# Patient Record
Sex: Male | Born: 1970 | Race: White | Hispanic: No | Marital: Single | Smoking: Current every day smoker
Health system: Southern US, Community
[De-identification: ages and names within clinical notes are randomized; demographics above are authoritative.]

---

## 2012-05-04 ENCOUNTER — Encounter (HOSPITAL_BASED_OUTPATIENT_CLINIC_OR_DEPARTMENT_OTHER): Payer: Self-pay

## 2012-05-04 ENCOUNTER — Other Ambulatory Visit (HOSPITAL_BASED_OUTPATIENT_CLINIC_OR_DEPARTMENT_OTHER): Payer: Self-pay | Admitting: Family Medicine

## 2012-05-04 ENCOUNTER — Ambulatory Visit (HOSPITAL_BASED_OUTPATIENT_CLINIC_OR_DEPARTMENT_OTHER)
Admission: RE | Admit: 2012-05-04 | Discharge: 2012-05-04 | Disposition: A | Discharge status: Home / Self Care | Payer: BC Managed Care – PPO | Source: Ambulatory Visit | Attending: Family Medicine | Admitting: Family Medicine

## 2012-05-04 DIAGNOSIS — R109 Unspecified abdominal pain: Secondary | ICD-10-CM

## 2012-05-04 MED ORDER — IOHEXOL 300 MG/ML  SOLN
100.0000 mL | Freq: Once | INTRAMUSCULAR | Status: AC | PRN
  Administered 2012-05-04: 100 mL via INTRAVENOUS

## 2014-05-06 ENCOUNTER — Other Ambulatory Visit: Payer: Self-pay | Admitting: Physician Assistant

## 2014-05-06 DIAGNOSIS — R7401 Elevation of levels of liver transaminase levels: Secondary | ICD-10-CM

## 2014-05-06 DIAGNOSIS — R740 Nonspecific elevation of levels of transaminase and lactic acid dehydrogenase [LDH]: Principal | ICD-10-CM

## 2014-05-07 ENCOUNTER — Ambulatory Visit (INDEPENDENT_AMBULATORY_CARE_PROVIDER_SITE_OTHER): Payer: Managed Care, Other (non HMO)

## 2014-05-07 DIAGNOSIS — R7401 Elevation of levels of liver transaminase levels: Secondary | ICD-10-CM

## 2014-05-07 DIAGNOSIS — R740 Nonspecific elevation of levels of transaminase and lactic acid dehydrogenase [LDH]: Secondary | ICD-10-CM

## 2015-04-17 ENCOUNTER — Encounter (HOSPITAL_BASED_OUTPATIENT_CLINIC_OR_DEPARTMENT_OTHER): Payer: Self-pay | Admitting: *Deleted

## 2015-04-17 ENCOUNTER — Emergency Department (HOSPITAL_BASED_OUTPATIENT_CLINIC_OR_DEPARTMENT_OTHER)
Admission: EM | Admit: 2015-04-17 | Discharge: 2015-04-17 | Disposition: A | Discharge status: Home / Self Care | Payer: Managed Care, Other (non HMO) | Attending: Emergency Medicine | Admitting: Emergency Medicine

## 2015-04-17 ENCOUNTER — Emergency Department (HOSPITAL_BASED_OUTPATIENT_CLINIC_OR_DEPARTMENT_OTHER): Payer: Managed Care, Other (non HMO)

## 2015-04-17 DIAGNOSIS — F17210 Nicotine dependence, cigarettes, uncomplicated: Secondary | ICD-10-CM | POA: Insufficient documentation

## 2015-04-17 DIAGNOSIS — R079 Chest pain, unspecified: Secondary | ICD-10-CM | POA: Diagnosis not present

## 2015-04-17 LAB — BASIC METABOLIC PANEL
ANION GAP: 7 (ref 5–15)
BUN: 11 mg/dL (ref 6–20)
CALCIUM: 9.2 mg/dL (ref 8.9–10.3)
CO2: 25 mmol/L (ref 22–32)
Chloride: 106 mmol/L (ref 101–111)
Creatinine, Ser: 0.88 mg/dL (ref 0.61–1.24)
GLUCOSE: 93 mg/dL (ref 65–99)
POTASSIUM: 3.9 mmol/L (ref 3.5–5.1)
SODIUM: 138 mmol/L (ref 135–145)

## 2015-04-17 LAB — CBC
HCT: 43.5 % (ref 39.0–52.0)
HEMOGLOBIN: 15.4 g/dL (ref 13.0–17.0)
MCH: 30.9 pg (ref 26.0–34.0)
MCHC: 35.4 g/dL (ref 30.0–36.0)
MCV: 87.3 fL (ref 78.0–100.0)
Platelets: 250 10*3/uL (ref 150–400)
RBC: 4.98 MIL/uL (ref 4.22–5.81)
RDW: 12.1 % (ref 11.5–15.5)
WBC: 7.2 10*3/uL (ref 4.0–10.5)

## 2015-04-17 LAB — TROPONIN I

## 2015-04-17 MED ORDER — NAPROXEN 500 MG PO TABS: 500.0000 mg | ORAL_TABLET | Freq: Two times a day (BID) | ORAL | Status: AC

## 2015-04-17 MED ORDER — ASPIRIN 81 MG PO CHEW
324.0000 mg | CHEWABLE_TABLET | Freq: Once | ORAL | Status: AC
  Administered 2015-04-17: 324 mg via ORAL
  Filled 2015-04-17: qty 4

## 2015-04-17 MED ORDER — MORPHINE SULFATE (PF) 4 MG/ML IV SOLN
4.0000 mg | Freq: Once | INTRAVENOUS | Status: AC
  Administered 2015-04-17: 4 mg via INTRAVENOUS
  Filled 2015-04-17: qty 1

## 2015-04-17 MED FILL — NAPROXEN 500 MG TABLET: 500 | 15 days supply | Qty: 30 | Fill #0

## 2015-04-17 NOTE — ED Notes (Signed)
MD at bedside discussing test results and dispo plan of care. 

## 2015-04-17 NOTE — ED Notes (Signed)
Pt has texted his girlfriend to come get him.  Pt voiced understanding that he is not allowed to drive.

## 2015-04-17 NOTE — Discharge Instructions (Signed)

## 2015-04-17 NOTE — ED Provider Notes (Signed)
CSN: [id number]     Arrival date & time 04/17/15  1157 History   First MD Initiated Contact with Patient 04/17/15 1324     Chief Complaint  Patient presents with  . Chest Pain    Patient is a 45 y.o. male presenting with chest pain. The history is provided by the patient.  Chest Pain Pain location:  L chest Pain quality: not aching, not burning, no pressure and not sharp   Pain quality comment:  Cant describe the pain Pain radiates to:  Does not radiate Pain severity:  Moderate Onset quality:  Sudden Duration:  6 hours Timing:  Constant Chronicity:  New Relieved by:  Nothing Exacerbated by: turning his head a certain way does increase the pain. Associated symptoms: no abdominal pain, no back pain, no fever, no shortness of breath and not vomiting   Risk factors: smoking   Risk factors: no aortic disease, no coronary artery disease, no prior DVT/PE and no surgery   No family history of heart disease.  History reviewed. No pertinent past medical history. History reviewed. No pertinent past surgical history. No family history on file. Social History  Substance Use Topics  . Smoking status: Current Every Day Smoker -- 1.00 packs/day    Types: Cigarettes  . Smokeless tobacco: None  . Alcohol Use: None    Review of Systems  Constitutional: Negative for fever.  Respiratory: Negative for shortness of breath.   Cardiovascular: Positive for chest pain.  Gastrointestinal: Negative for vomiting and abdominal pain.  Musculoskeletal: Negative for back pain.  All other systems reviewed and are negative.     Allergies  Review of patient's allergies indicates no known allergies.  Home Medications   Prior to Admission medications   Medication Sig Start Date End Date Taking? Authorizing Provider  naproxen (NAPROSYN) 500 MG tablet Take 1 tablet (500 mg total) by mouth 2 (two) times daily. 04/17/15   Linwood Dibbles, MD   BP 117/83 mmHg  Pulse 80  Temp(Src) 98 F (36.7 C) (Oral)  Resp  20  Ht 5\' 10"  (1.778 m)  Wt 77.111 kg  BMI 24.39 kg/m2  SpO2 95% Physical Exam  Constitutional: He appears well-developed and well-nourished. No distress.  HENT:  Head: Normocephalic and atraumatic.  Right Ear: External ear normal.  Left Ear: External ear normal.  Eyes: Conjunctivae are normal. Right eye exhibits no discharge. Left eye exhibits no discharge. No scleral icterus.  Neck: Neck supple. No tracheal deviation present.  Cardiovascular: Normal rate, regular rhythm and intact distal pulses.   Pulmonary/Chest: Effort normal and breath sounds normal. No stridor. No respiratory distress. He has no wheezes. He has no rales.  Abdominal: Soft. Bowel sounds are normal. He exhibits no distension. There is no tenderness. There is no rebound and no guarding.  Musculoskeletal: He exhibits no edema or tenderness.  Neurological: He is alert. He has normal strength. No cranial nerve deficit (no facial droop, extraocular movements intact, no slurred speech) or sensory deficit. He exhibits normal muscle tone. He displays no seizure activity. Coordination normal.  Skin: Skin is warm and dry. No rash noted.  Psychiatric: He has a normal mood and affect.  Nursing note and vitals reviewed.   ED Course  Procedures (including critical care time) Labs Review Labs Reviewed  BASIC METABOLIC PANEL  CBC  TROPONIN I  TROPONIN I    Imaging Review Dg Chest 2 View  04/17/2015  CLINICAL DATA:  Chest pain starting this morning.  Smoking history EXAM: CHEST  2 VIEW COMPARISON:  None. FINDINGS: The heart size and mediastinal contours are within normal limits. Both lungs are clear. The visualized skeletal structures are unremarkable. IMPRESSION: Lungs are clear and there is no evidence of acute cardiopulmonary abnormality. Electronically Signed   By: Bary RichardStan  Maynard M.D.   On: 04/17/2015 13:37   I have personally reviewed and evaluated these images and lab results as part of my medical decision-making.   EKG  Interpretation   Date/Time:  Friday April 17 2015 12:09:04 EST Ventricular Rate:  83 PR Interval:  130 QRS Duration: 84 QT Interval:  344 QTC Calculation: 404 R Axis:   79 Text Interpretation:  Normal sinus rhythm Normal ECG No old tracing to  compare Confirmed by Hildred Pharo  MD-J, Darran Gabay ([id number]) on 04/17/2015 12:18:12 PM      MDM   Final diagnoses:  Chest pain, unspecified chest pain type    Pt is low risk for heat disease.   Heart score is zero.   No dyspnea.  No pe risk factors.  PERC negative. No tearing pain.  No back pain.  Doubt aortic dissection.  Doubt ACS, PE.  Plan on 2 sets of cardiac enzymes.  If negative, dc with nsaids.  Follow up with PCP.      Linwood DibblesJon Shakir Petrosino, MD 04/17/15 [phone]

## 2015-04-17 NOTE — ED Notes (Signed)
C/o chest pain above left breast. bil arm tingling . No other sx.

## 2018-11-22 ENCOUNTER — Other Ambulatory Visit (HOSPITAL_BASED_OUTPATIENT_CLINIC_OR_DEPARTMENT_OTHER): Payer: Self-pay | Admitting: Physician Assistant

## 2018-11-22 DIAGNOSIS — R748 Abnormal levels of other serum enzymes: Secondary | ICD-10-CM

## 2018-11-26 ENCOUNTER — Other Ambulatory Visit: Payer: Managed Care, Other (non HMO)

## 2018-11-27 ENCOUNTER — Ambulatory Visit (INDEPENDENT_AMBULATORY_CARE_PROVIDER_SITE_OTHER): Payer: Managed Care, Other (non HMO)

## 2018-11-27 ENCOUNTER — Other Ambulatory Visit: Payer: Self-pay

## 2018-11-27 DIAGNOSIS — R748 Abnormal levels of other serum enzymes: Secondary | ICD-10-CM | POA: Diagnosis not present

## 2018-12-20 ENCOUNTER — Other Ambulatory Visit (HOSPITAL_BASED_OUTPATIENT_CLINIC_OR_DEPARTMENT_OTHER): Payer: Self-pay | Admitting: Physician Assistant

## 2018-12-20 DIAGNOSIS — R935 Abnormal findings on diagnostic imaging of other abdominal regions, including retroperitoneum: Secondary | ICD-10-CM

## 2018-12-26 ENCOUNTER — Other Ambulatory Visit: Payer: Self-pay

## 2018-12-26 ENCOUNTER — Ambulatory Visit (HOSPITAL_BASED_OUTPATIENT_CLINIC_OR_DEPARTMENT_OTHER)
Admission: RE | Admit: 2018-12-26 | Discharge: 2018-12-26 | Disposition: A | Discharge status: Home / Self Care | Payer: Managed Care, Other (non HMO) | Source: Ambulatory Visit | Attending: Physician Assistant | Admitting: Physician Assistant

## 2018-12-26 ENCOUNTER — Encounter (HOSPITAL_BASED_OUTPATIENT_CLINIC_OR_DEPARTMENT_OTHER): Payer: Self-pay

## 2018-12-26 DIAGNOSIS — R935 Abnormal findings on diagnostic imaging of other abdominal regions, including retroperitoneum: Secondary | ICD-10-CM | POA: Insufficient documentation

## 2018-12-26 MED ORDER — IOHEXOL 350 MG/ML SOLN
100.0000 mL | Freq: Once | INTRAVENOUS | Status: AC | PRN
  Administered 2018-12-26: 100 mL via INTRAVENOUS

## 2021-08-09 IMAGING — US ULTRASOUND ABDOMEN COMPLETE
1 series · 13 of 25 positions shown · non-contrast
Comparison: May 07, 2014

CLINICAL DATA: Abnormal liver enzymes

EXAM:
ABDOMEN ULTRASOUND COMPLETE

[Series 1: ultrasound abdomen complete · 0.17mm/px · 13 of 180 slices shown]
[im 1/180]
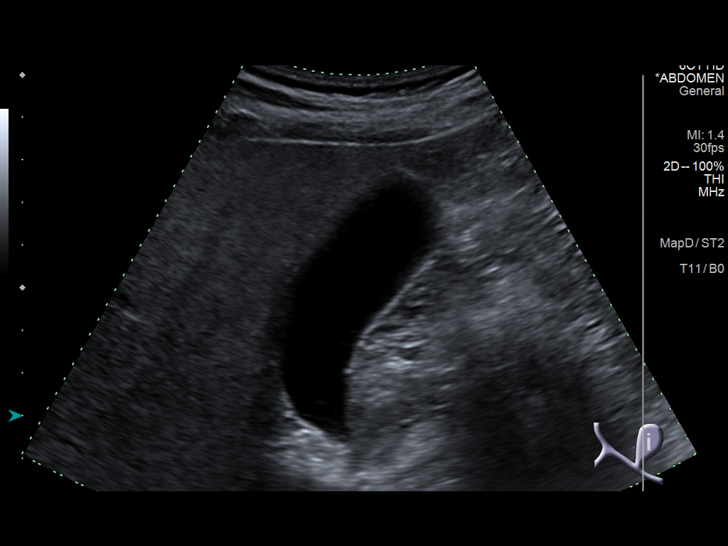
[im 15/180]
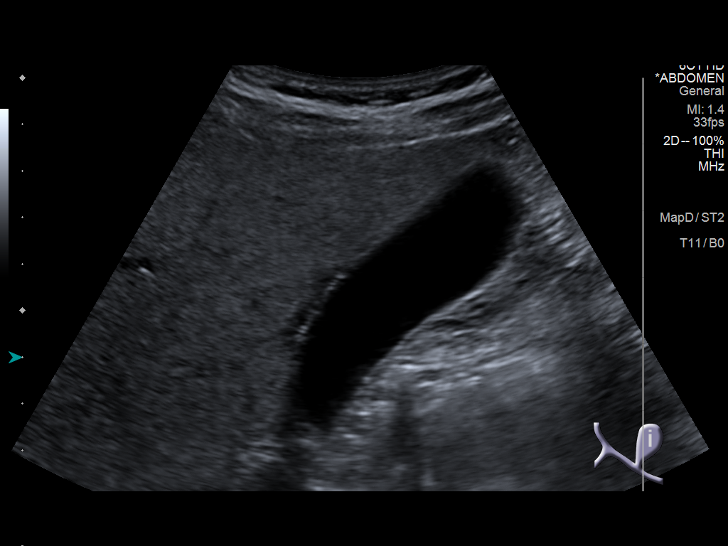
[im 30/180]
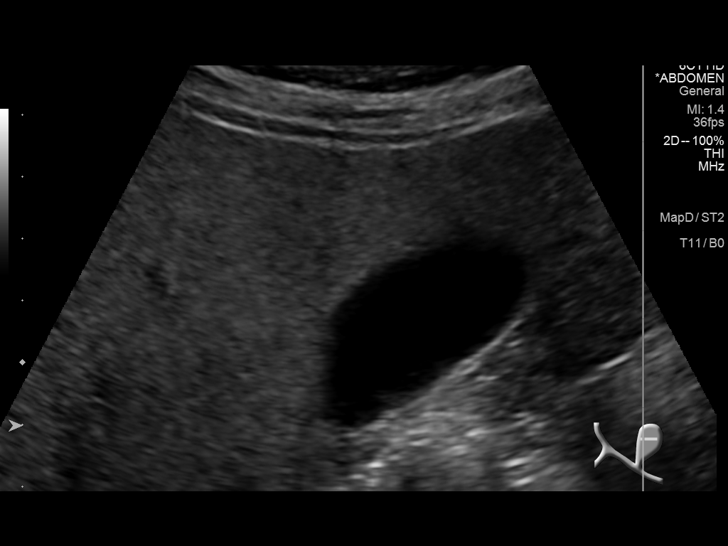
[im 45/180]
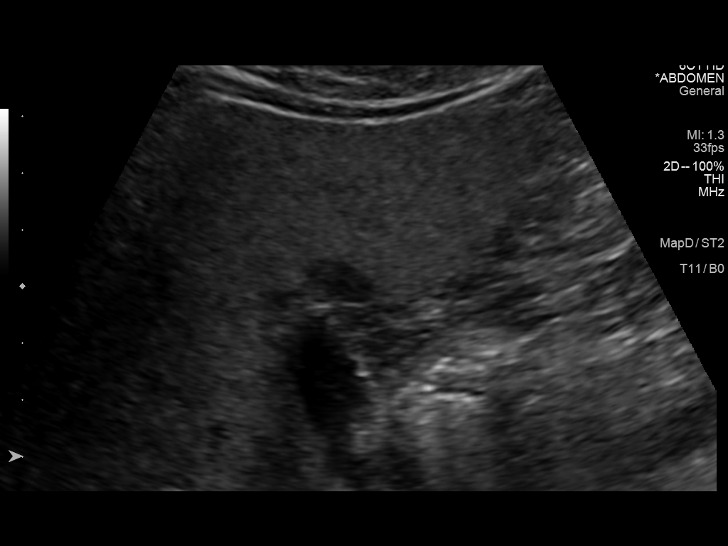
[im 60/180]
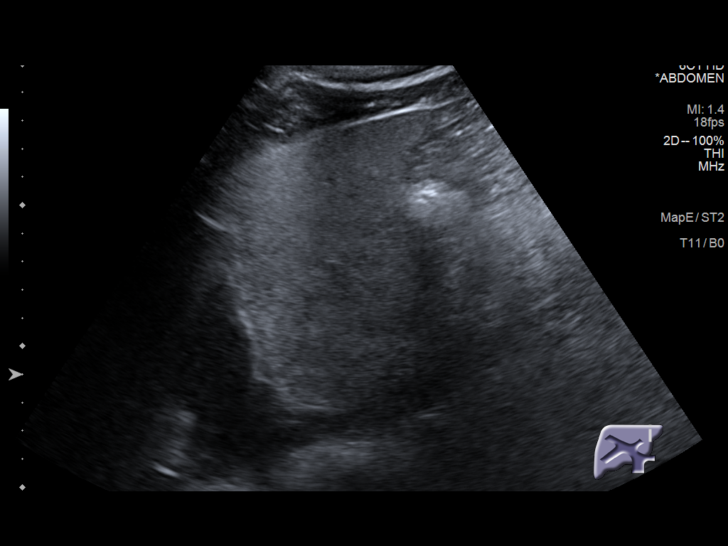
[im 75/180]
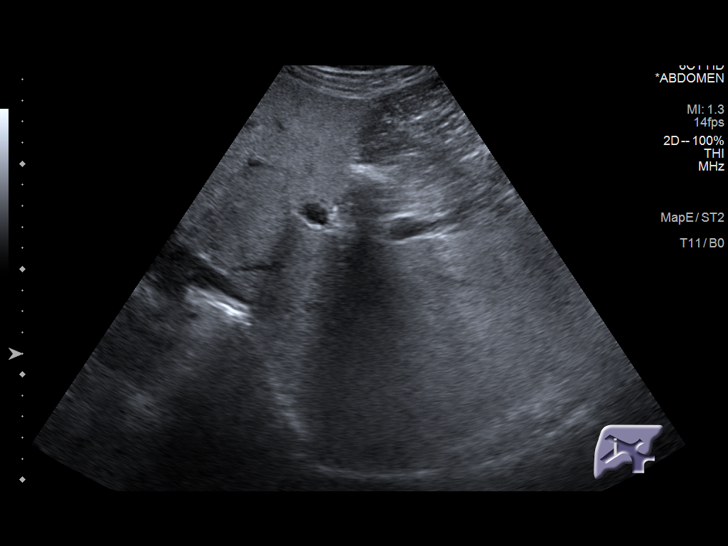
[im 90/180]
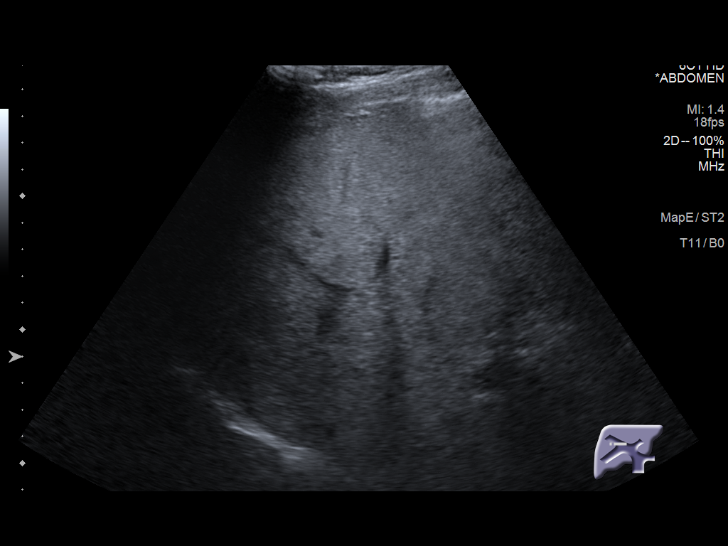
[im 105/180]
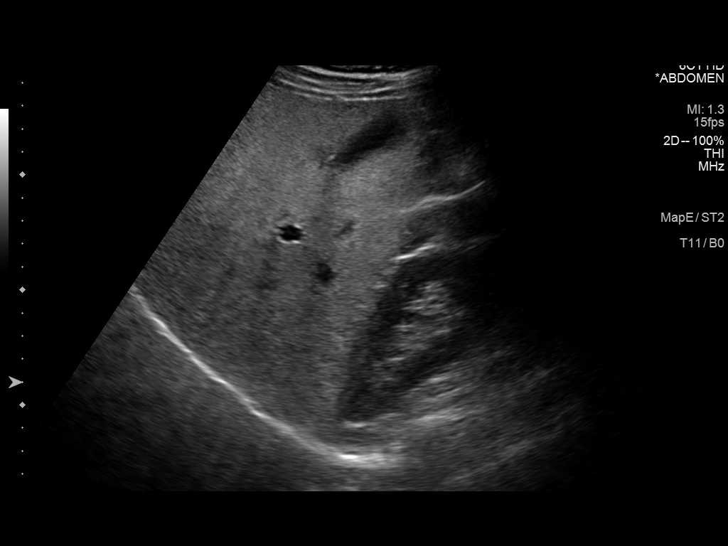
[im 120/180]
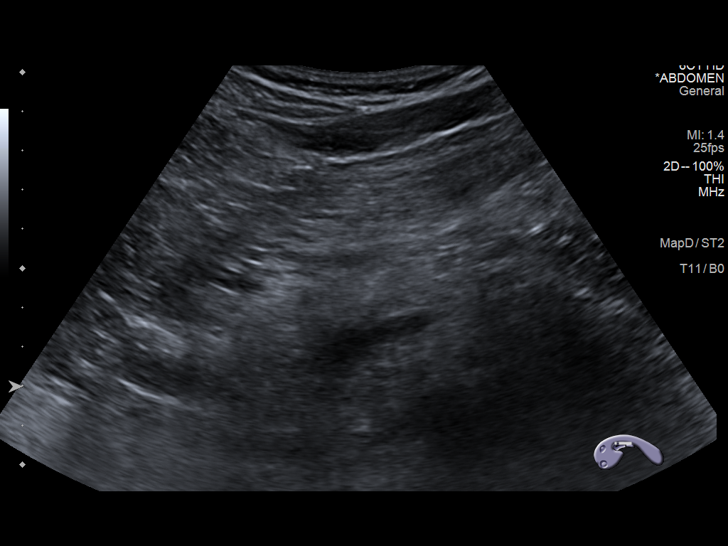
[im 135/180]
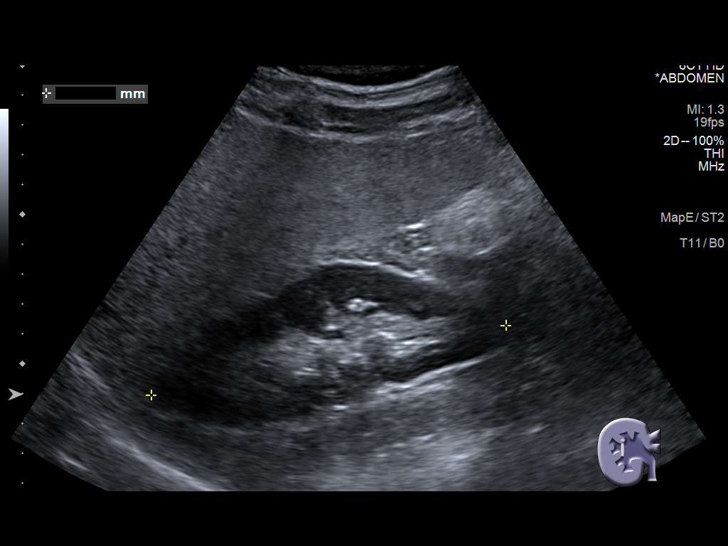
[im 150/180]
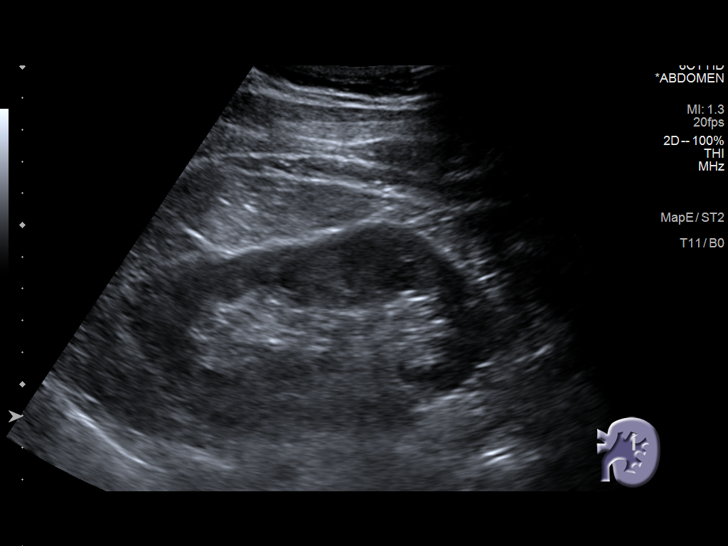
[im 165/180]
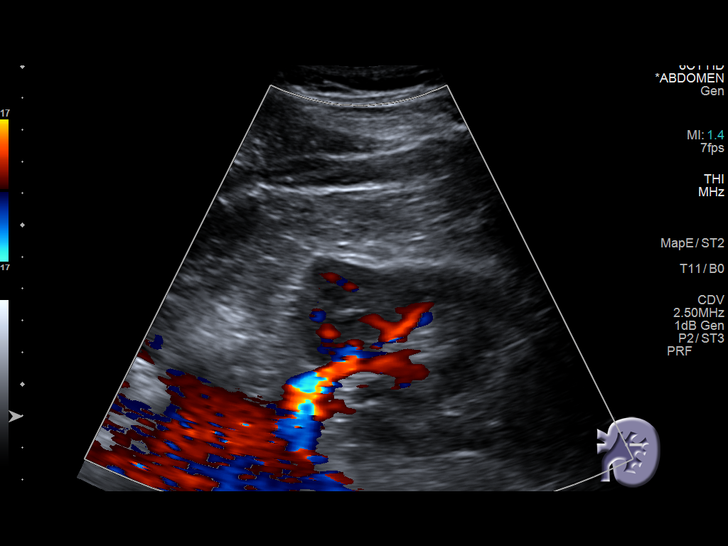
[im 180/180]
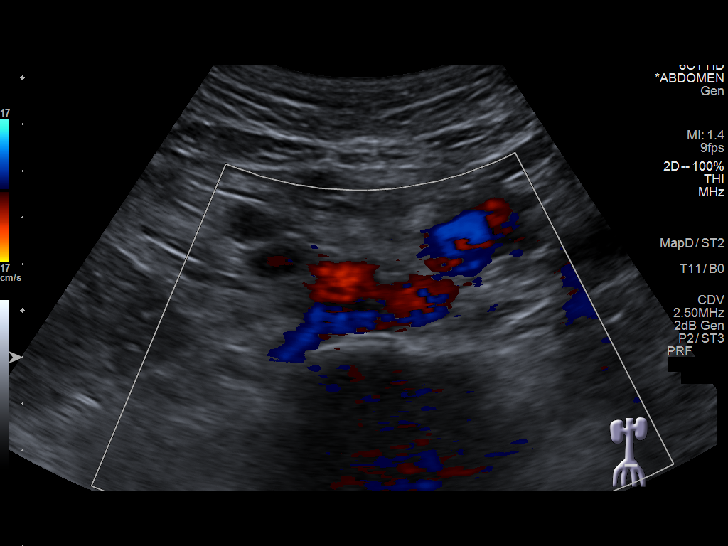

[13 of 25 positions shown; findings below may reference images not displayed]

FINDINGS: Gallbladder: No gallstones or wall thickening visualized. There is
no pericholecystic fluid. No sonographic Murphy sign noted by
sonographer.

Common bile duct: Diameter: 2 mm. No intrahepatic, common hepatic,
or common bile duct dilatation.

Liver: Liver echogenicity is increased diffusely. There is a
somewhat decreased echogenicity focus in the right lobe of the liver
near the gallbladder fossa measuring 2.3 x 1.8 x 2.7 cm. No other
focal liver lesion evident. Portal vein is patent on color Doppler
imaging with normal direction of blood flow towards the liver.

IVC: No abnormality visualized.

Pancreas: No pancreatic mass or inflammatory focus.

Spleen: Size and appearance within normal limits.

Right Kidney: Length: 12.1 cm. Echogenicity within normal limits. No
mass or hydronephrosis visualized.

Left Kidney: Length: 11.9 cm. Echogenicity within normal limits. No
mass or hydronephrosis visualized.

Abdominal aorta: No aneurysm visualized.

Other findings: No demonstrable ascites.
IMPRESSION: 1. Diffuse increase in liver echogenicity, a finding indicative of
hepatic steatosis.

2. Focal area of decreased attenuation in the right lobe of the
liver toward the gallbladder fossa measuring 2.3 x 1.8 x 2.7 cm.
This area potentially may represent fatty sparing. Given its
somewhat masslike appearance, however, it would be prudent to
correlate with MR or CT of the liver pre and serial post-contrast to
further assess.

3.  Study otherwise unremarkable.

## 2021-09-07 IMAGING — CT CT ABDOMEN WO/W CM
2 of 9 series · 11 of 46 positions shown, 17 images · IV contrast (omnipaque)
Comparison: Ultrasound on 11/27/2018, and CT on 05/04/2012

CLINICAL DATA: Elevated liver function tests. Indeterminate liver
lesion on recent ultrasound.

EXAM:
CT ABDOMEN WITHOUT AND WITH CONTRAST
TECHNIQUE: Multidetector CT imaging of the abdomen was performed following the
standard protocol before and following the bolus administration of
intravenous contrast.
CONTRAST:  100mL OMNIPAQUE IOHEXOL 350 MG/ML SOLN

[Series 4: coronal arterial · coronal · arterial · 0.51mm/px · 3 of 88 slices shown, 4 images]
[im 22/88  soft-tissue]
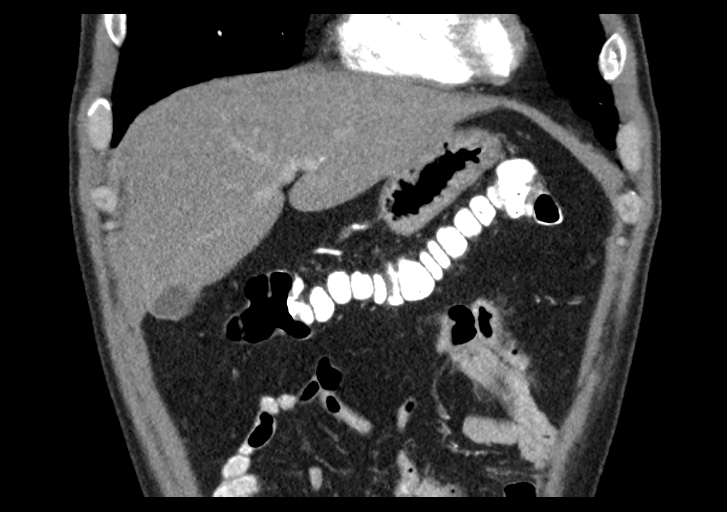
[im 44/88  soft-tissue]
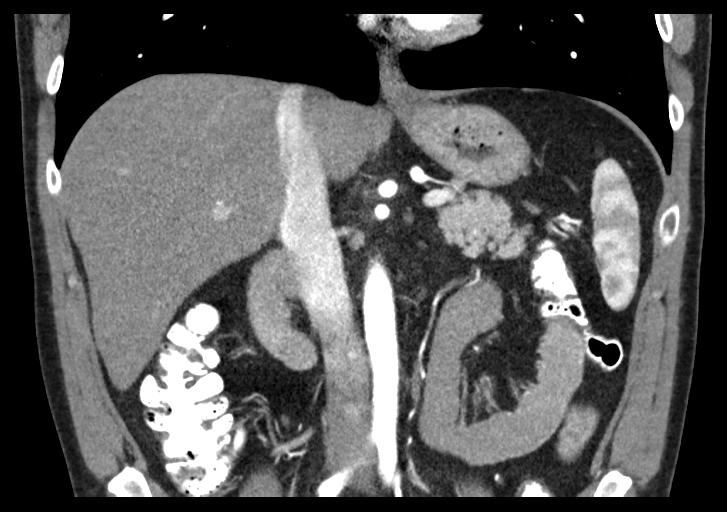
[im 44/88  bone]
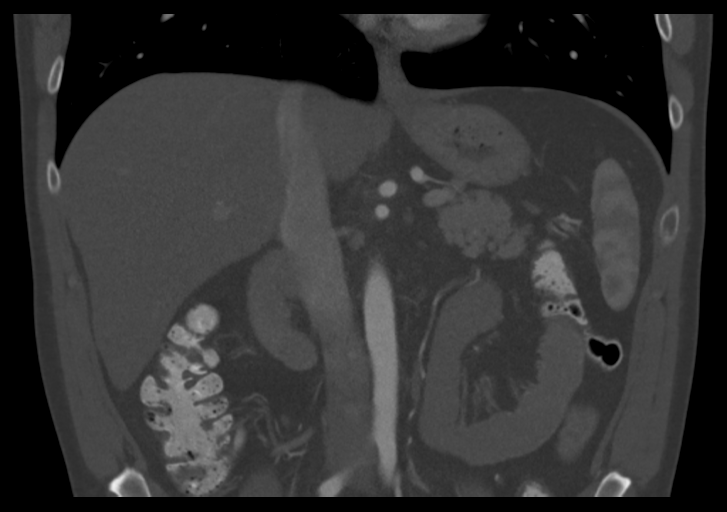
[im 66/88  soft-tissue]
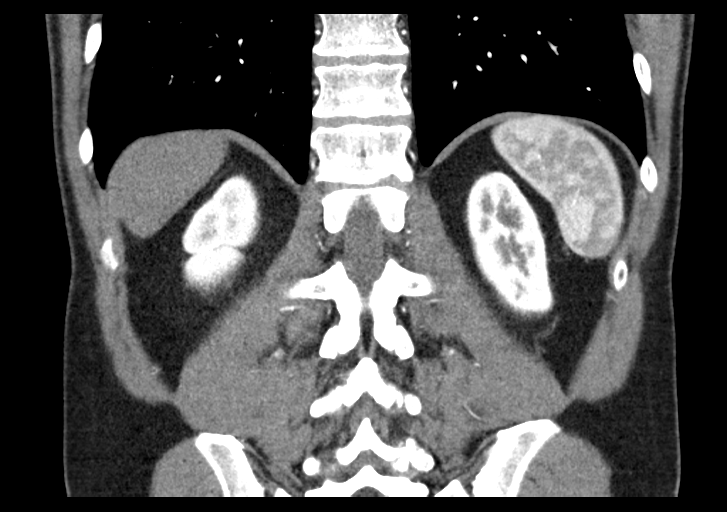

[Series 7: axial venous · axial · portal-venous · 0.76mm/px · z∈[-292,-103]mm · 8 of 82 slices shown, 13 images]
[im 10/82  soft-tissue]
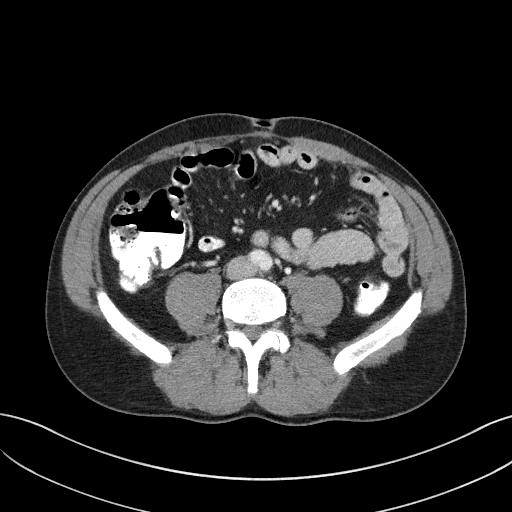
[im 10/82  bone]
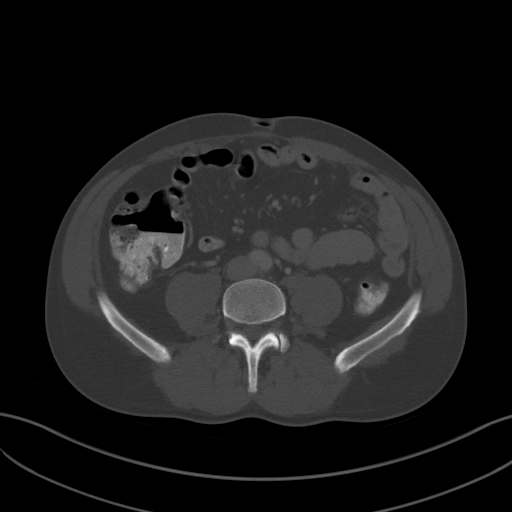
[im 19/82  soft-tissue]
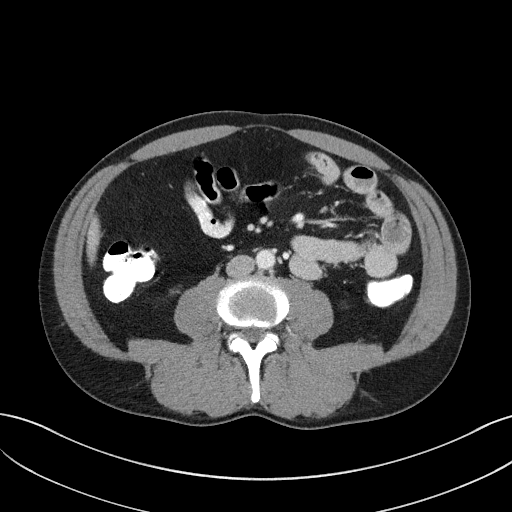
[im 28/82  soft-tissue]
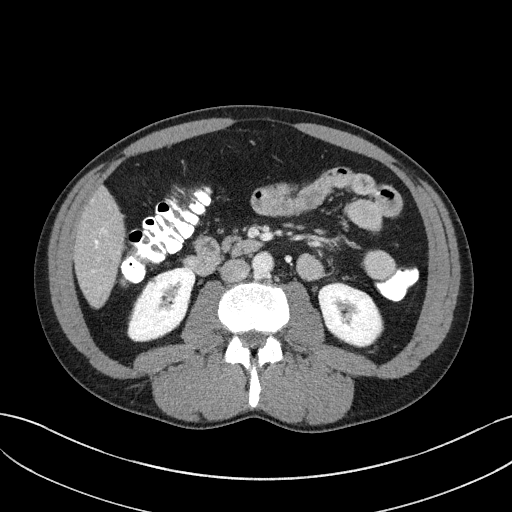
[im 37/82  soft-tissue]
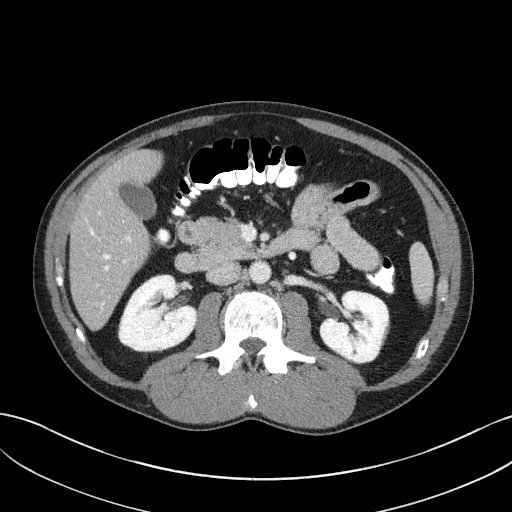
[im 46/82  soft-tissue]
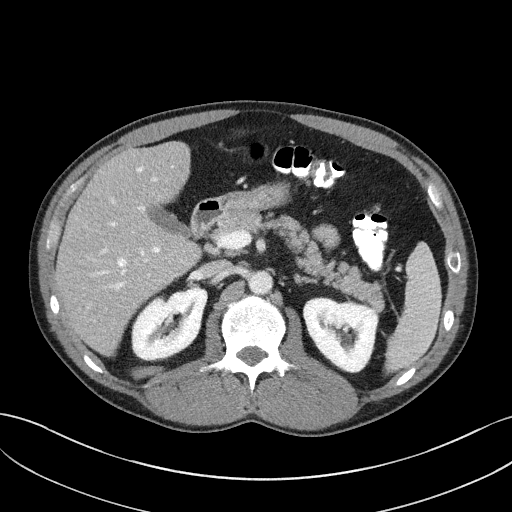
[im 46/82  lung]
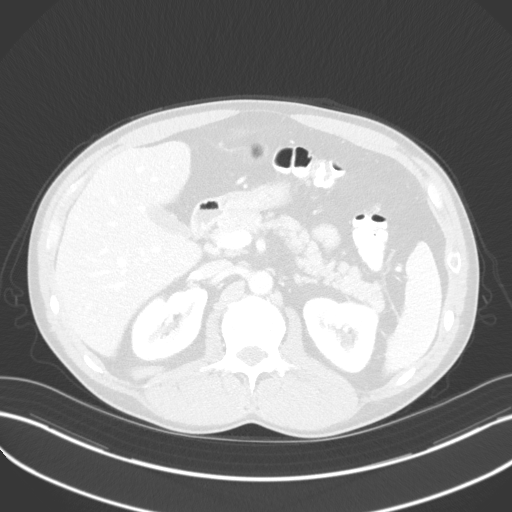
[im 55/82  soft-tissue]
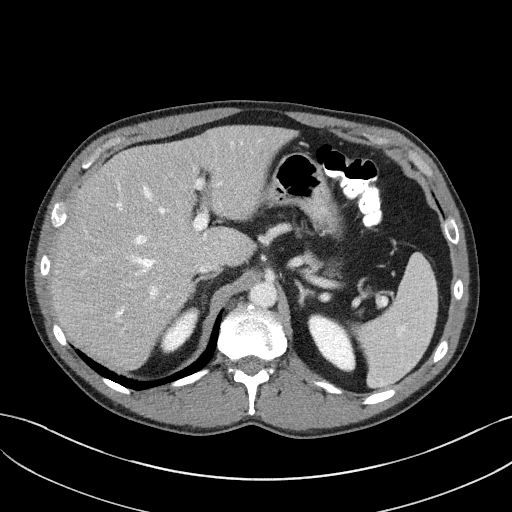
[im 55/82  lung]
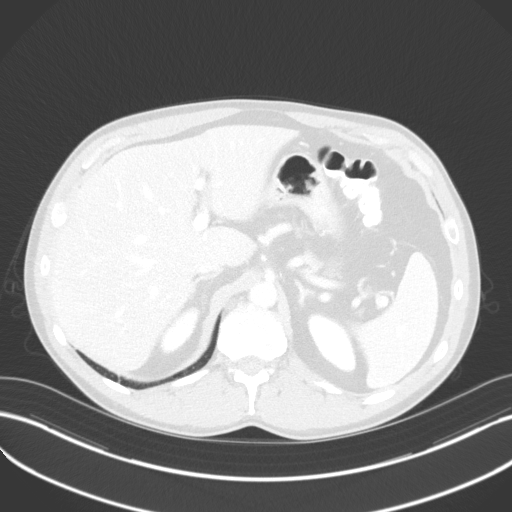
[im 64/82  soft-tissue]
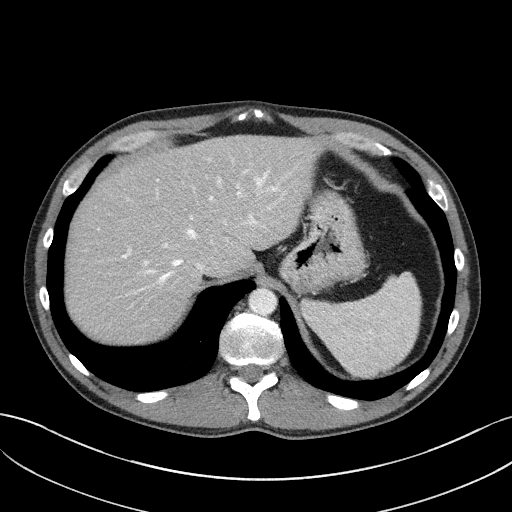
[im 64/82  lung]
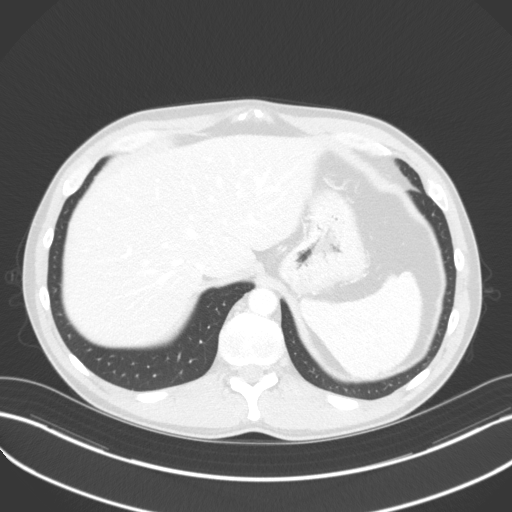
[im 73/82  soft-tissue]
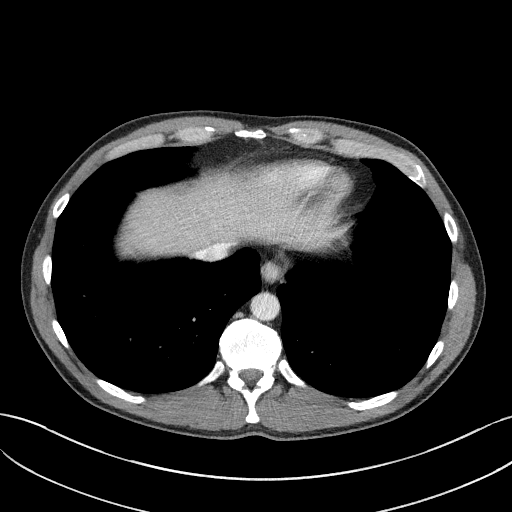
[im 73/82  lung]
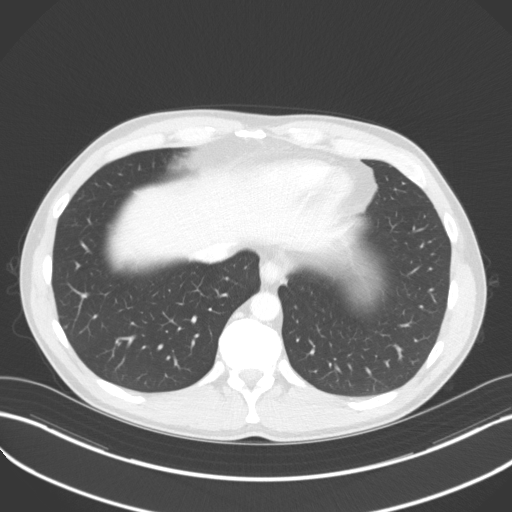

[11 of 46 positions shown; findings below may reference images not displayed]

FINDINGS: Lower chest: No acute findings.

Hepatobiliary: Mild diffuse hepatic steatosis is seen. No liver
masses or other parenchymal abnormality identified. Gallbladder is
unremarkable. No evidence of biliary ductal dilatation.

Pancreas:  No mass or inflammatory changes.

Spleen:  Within normal limits in size and appearance.

Adrenals/Urinary Tract: No masses identified. No evidence of
hydronephrosis.

Stomach/Bowel: Visualized portion unremarkable.

Vascular/Lymphatic: No pathologically enlarged lymph nodes
identified. No abdominal aortic aneurysm.

Other:  None.

Musculoskeletal:  No suspicious bone lesions identified.
IMPRESSION: Mild hepatic steatosis. No evidence of hepatic mass or other
significant abnormality.

## 2022-03-22 ENCOUNTER — Other Ambulatory Visit: Payer: Self-pay | Admitting: Family Medicine

## 2022-03-22 ENCOUNTER — Ambulatory Visit (INDEPENDENT_AMBULATORY_CARE_PROVIDER_SITE_OTHER): Payer: Managed Care, Other (non HMO)

## 2022-03-22 DIAGNOSIS — R059 Cough, unspecified: Secondary | ICD-10-CM

## 2022-03-22 DIAGNOSIS — R0781 Pleurodynia: Secondary | ICD-10-CM

## 2022-12-30 ENCOUNTER — Other Ambulatory Visit: Payer: Self-pay | Admitting: Family Medicine

## 2022-12-30 DIAGNOSIS — R079 Chest pain, unspecified: Secondary | ICD-10-CM

## 2023-01-12 ENCOUNTER — Ambulatory Visit (INDEPENDENT_AMBULATORY_CARE_PROVIDER_SITE_OTHER): Payer: Self-pay

## 2023-01-12 DIAGNOSIS — R079 Chest pain, unspecified: Secondary | ICD-10-CM
